# Patient Record
Sex: Male | Born: 2000 | Race: White | Hispanic: No | Marital: Single | Smoking: Current every day smoker
Health system: Southern US, Community
[De-identification: ages and names within clinical notes are randomized; demographics above are authoritative.]

## PROBLEM LIST (undated history)

## (undated) DIAGNOSIS — F909 Attention-deficit hyperactivity disorder, unspecified type: Secondary | ICD-10-CM

## (undated) HISTORY — DX: Attention-deficit hyperactivity disorder, unspecified type: F90.9

---
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
## 2018-01-27 ENCOUNTER — Other Ambulatory Visit: Payer: Self-pay

## 2018-01-27 ENCOUNTER — Emergency Department (HOSPITAL_COMMUNITY)
Admission: EM | Admit: 2018-01-27 | Discharge: 2018-01-28 | Disposition: A | Discharge status: Home / Self Care | Payer: No Typology Code available for payment source | Attending: Emergency Medicine | Admitting: Emergency Medicine

## 2018-01-27 DIAGNOSIS — Y999 Unspecified external cause status: Secondary | ICD-10-CM | POA: Diagnosis not present

## 2018-01-27 DIAGNOSIS — S0990XA Unspecified injury of head, initial encounter: Secondary | ICD-10-CM

## 2018-01-27 DIAGNOSIS — Y929 Unspecified place or not applicable: Secondary | ICD-10-CM | POA: Insufficient documentation

## 2018-01-27 DIAGNOSIS — J45909 Unspecified asthma, uncomplicated: Secondary | ICD-10-CM | POA: Insufficient documentation

## 2018-01-27 DIAGNOSIS — S098XXA Other specified injuries of head, initial encounter: Secondary | ICD-10-CM | POA: Diagnosis present

## 2018-01-27 DIAGNOSIS — Y9351 Activity, roller skating (inline) and skateboarding: Secondary | ICD-10-CM | POA: Diagnosis not present

## 2018-01-27 NOTE — ED Triage Notes (Signed)
Patient states that he was skateboarding without a helmet when her fell and hit the back of his head on concrete. Denies LOC but c/o confusion and nausea earlier that has resolved.

## 2018-01-27 NOTE — ED Provider Notes (Signed)
[facility] EMERGENCY DEPARTMENT Provider Note   CSN: [id number] Arrival date & time: 01/27/18  2240     History   Chief Complaint Chief Complaint  Patient presents with  . Head Injury    HPI Donald Goodman is a 17 y.o. male presents today for evaluation of acute onset, gradually improving headache.  He states that at around 5 PM earlier today he was skateboarding in a skate park when the skateboard slipped out from under him and he fell backwards landing on the back of his head.  He denies loss of consciousness but states he is unsure of the events leading up to the fall.  He notes that since then he has had a sharp occipital headache which does not radiate.  It improved after he smoked some marijuana.  No medications prior to arrival.  He does note intermittent vision changes but has difficulty describing them.  He notes nausea but no vomiting.  He states that he sometimes feels unsteady when he ambulates.  He states that he does frequently feel confused and has difficulty following commands, remembering things, or answering questions.  He denies numbness, tingling, weakness, neck pain, back pain, chest pain, or shortness of breath.  The history is provided by the patient.    No past medical history on file.  Patient Active Problem List   Diagnosis Date Noted  . ASTHMA 05/21/2010  . FINGER PAIN 05/21/2010  . FRACTURE, FINGER 05/21/2010    Home Medications    Prior to Admission medications   Medication Sig Start Date End Date Taking? Authorizing Provider  acetaminophen (TYLENOL) 500 MG tablet Take 1 tablet (500 mg total) by mouth every 6 (six) hours as needed. 01/28/18   Hjalmar Ballengee A, PA-C  ibuprofen (ADVIL,MOTRIN) 600 MG tablet Take 1 tablet (600 mg total) by mouth every 6 (six) hours as needed. 01/28/18   Jeanie Sewer, PA-C    Family History No family history on file.  Social History Social History   Tobacco Use  . Smoking status: Not on file    Substance Use Topics  . Alcohol use: Not on file  . Drug use: Not on file     Allergies   Patient has no known allergies.   Review of Systems Review of Systems  Eyes: Positive for photophobia and visual disturbance (?).  Respiratory: Negative for shortness of breath.   Cardiovascular: Negative for chest pain.  Gastrointestinal: Positive for nausea. Negative for abdominal pain and vomiting.  Neurological: Positive for headaches. Negative for syncope, weakness and numbness.  Psychiatric/Behavioral: Positive for confusion.  All other systems reviewed and are negative.    Physical Exam Updated Vital Signs BP (!) 95/45 (BP Location: Left Arm)   Pulse (!) 54   Temp 97.7 F (36.5 C) (Oral)   Resp 18   Ht  (1.727 m)   Wt 56.7 kg (125 lb)   SpO2 100%   BMI 19.01 kg/m   Physical Exam  Constitutional: He is oriented to person, place, and time. He appears well-developed and well-nourished. No distress.  HENT:  Head: Normocephalic.  No Battle's signs, no raccoon's eyes, no rhinorrhea. No hemotympanum. No tenderness to palpation of the face. No deformity, crepitus, or swelling noted.  There is mild tenderness to palpation of the posterior aspect of the skull, worse on the left.  There is no overlying crepitus, ecchymosis, or deformity but there is mild swelling   Eyes: Pupils are equal, round, and reactive to light.  Conjunctivae and EOM are normal. Right eye exhibits no discharge. Left eye exhibits no discharge.  Neck: Normal range of motion. Neck supple. No JVD present. No tracheal deviation present.  No midline spine TTP, no paraspinal muscle tenderness, no deformity, crepitus, or step-off noted   Cardiovascular: Normal rate, regular rhythm and normal heart sounds.  Pulmonary/Chest: Effort normal and breath sounds normal.  Abdominal: Soft. He exhibits no distension. There is no tenderness.  Musculoskeletal: Normal range of motion. He exhibits no edema or tenderness.  5/5  strength of BUE and BLE major muscle groups.  No tenderness to palpation of the extremities.  Neurological: He is alert and oriented to person, place, and time. No cranial nerve deficit or sensory deficit. He exhibits normal muscle tone.  Mental Status:  Alert, thought content appropriate.  Some confusion when providing history.  Speech fluent without evidence of aphasia.  Some difficulty following two-step commands  cranial Nerves:  II:  Peripheral visual fields grossly normal, pupils equal, round, reactive to light III,IV, VI: ptosis not present, extra-ocular motions intact bilaterally  V,VII: smile symmetric, facial light touch sensation equal VIII: hearing grossly normal to voice  X: uvula elevates symmetrically  XI: bilateral shoulder shrug symmetric and strong XII: midline tongue extension without fassiculations Motor:  Normal tone. 5/5 strength of BUE and BLE major muscle groups including strong and equal grip strength and dorsiflexion/plantar flexion Sensory: light touch normal in all extremities. Cerebellar: normal finger-to-nose with bilateral upper extremities Gait: normal gait and balance. Able to walk on toes and heels with ease.     Skin: Skin is warm and dry. No erythema.  Psychiatric: He has a normal mood and affect. His behavior is normal.  Nursing note and vitals reviewed.    ED Treatments / Results  Labs (all labs ordered are listed, but only abnormal results are displayed) Labs Reviewed - No data to display  EKG None  Radiology Ct Head Wo Contrast  Result Date: 01/28/2018 CLINICAL DATA:  Posttraumatic headache after falling. Nausea and confusion. EXAM: CT HEAD WITHOUT CONTRAST TECHNIQUE: Contiguous axial images were obtained from the base of the skull through the vertex without intravenous contrast. COMPARISON:  None. FINDINGS: Brain: There is no mass, hemorrhage or extra-axial collection. No evidence of acute cortical infarct. The size and configuration of the  ventricles and extra-axial CSF spaces are normal. The brain parenchyma is normal. Vascular: No hyperdense vessel or unexpected vascular calcification. Skull: Normal visualized skull base, calvarium and extracranial soft tissues. Sinuses/Orbits: No fluid levels or advanced mucosal thickening of the visualized paranasal sinuses. No mastoid or middle ear effusion. The orbits are normal. IMPRESSION: Normal head CT. Electronically Signed   By: Deatra Robinson M.D.   On: 01/28/2018 01:02    Procedures Procedures (including critical care time)  Medications Ordered in ED Medications - No data to display   Initial Impression / Assessment and Plan / ED Course  I have reviewed the triage vital signs and the nursing notes.  Pertinent labs & imaging results that were available during my care of the patient were reviewed by me and considered in my medical decision making (see chart for details).     Patient presents for evaluation of headache after slipping off of his skateboard and striking the posterior aspect of his skull on the ground while not wearing a helmet.  He is afebrile, mildly bradycardic with borderline hypotensive blood pressure reading. However, upon chart review using care everywhere this appears to be the patient's baseline.  He was also sleeping upon reevaluation.  He has no focal neurologic deficits on examination but does endorse possible retrograde amnesia as well as confusion and nausea after the fall.  He is ambulatory without difficulty.  Likely postconcussive in nature but we will obtain CT scan to rule out acute abnormality.  CT scan of the head shows no evidence of skull fracture, ICH, SAH, or CVA.  On reevaluation the patient is resting comfortably.  We discussed concussion precautions and I encouraged him to avoid skating for at least a week.  I also encouraged him to wear a helmet every time that he states that he tells me that he bought one today.  Recommend follow-up with his  primary care physician or in the concussion clinic in [place].  Discussed strict ED return precautions. Pt verbalized understanding of and agreement with plan and is safe for discharge home at this time.  He has no complaints prior to discharge. Final Clinical Impressions(s) / ED Diagnoses   Final diagnoses:  Injury of head, initial encounter    ED Discharge Orders        Ordered    ibuprofen (ADVIL,MOTRIN) 600 MG tablet  Every 6 hours PRN     01/28/18 0108    acetaminophen (TYLENOL) 500 MG tablet  Every 6 hours PRN     01/28/18 0108       Jeanie Sewer, PA-C 01/28/18 0125    Jacalyn Lefevre, MD 01/28/18 [phone]

## 2018-01-28 ENCOUNTER — Emergency Department (HOSPITAL_COMMUNITY): Payer: No Typology Code available for payment source

## 2018-01-28 MED ORDER — IBUPROFEN 600 MG PO TABS: 600.0000 mg | ORAL_TABLET | Freq: Four times a day (QID) | ORAL | 0 refills | Status: AC | PRN

## 2018-01-28 MED ORDER — ACETAMINOPHEN 500 MG PO TABS: 500.0000 mg | ORAL_TABLET | Freq: Four times a day (QID) | ORAL | 0 refills | Status: AC | PRN

## 2018-01-28 NOTE — Discharge Instructions (Signed)
1. Medications: You may alternate 600 mg of ibuprofen and [phone] mg of Tylenol every 3 hours as needed for pain for the next 3 to 5 days. Do not exceed 4000 mg of Tylenol daily.  Take ibuprofen with food to avoid upset stomach issues. 2. Treatment: Rest, ice on head.  Concussion precautions given - keep patient in a quiet, not simulating, dark environment. No TV, computer use, or video games until headache is resolved completely. No contact sports until cleared by the physician.  Wear a helmet when you go skateboarding but I would avoid skateboarding for the next week or so. 3. Follow Up: With primary care physician on Monday if headache persists.  I have given you the information for the concussion clinic in [place].  Call them and set up a follow-up appointment.  Return to the emergency department if patient becomes weak, begins vomiting, develops double vision, speech difficulty, problems walking or other change in mental status.

## 2018-02-06 ENCOUNTER — Encounter (HOSPITAL_COMMUNITY): Payer: Self-pay

## 2018-02-06 ENCOUNTER — Emergency Department (HOSPITAL_COMMUNITY)
Admission: EM | Admit: 2018-02-06 | Discharge: 2018-02-06 | Disposition: A | Discharge status: Home / Self Care | Payer: No Typology Code available for payment source | Attending: Emergency Medicine | Admitting: Emergency Medicine

## 2018-02-06 ENCOUNTER — Other Ambulatory Visit: Payer: Self-pay

## 2018-02-06 DIAGNOSIS — J45909 Unspecified asthma, uncomplicated: Secondary | ICD-10-CM | POA: Diagnosis not present

## 2018-02-06 DIAGNOSIS — M545 Low back pain: Secondary | ICD-10-CM | POA: Insufficient documentation

## 2018-02-06 DIAGNOSIS — Y999 Unspecified external cause status: Secondary | ICD-10-CM | POA: Diagnosis not present

## 2018-02-06 DIAGNOSIS — M7918 Myalgia, other site: Secondary | ICD-10-CM

## 2018-02-06 DIAGNOSIS — Y92410 Unspecified street and highway as the place of occurrence of the external cause: Secondary | ICD-10-CM | POA: Diagnosis not present

## 2018-02-06 DIAGNOSIS — M542 Cervicalgia: Secondary | ICD-10-CM | POA: Diagnosis present

## 2018-02-06 DIAGNOSIS — Y929 Unspecified place or not applicable: Secondary | ICD-10-CM | POA: Diagnosis not present

## 2018-02-06 DIAGNOSIS — Y939 Activity, unspecified: Secondary | ICD-10-CM | POA: Insufficient documentation

## 2018-02-06 MED ORDER — METHOCARBAMOL 500 MG PO TABS: 500.0000 mg | ORAL_TABLET | Freq: Two times a day (BID) | ORAL | 0 refills | Status: AC
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
## 2018-02-06 NOTE — Discharge Instructions (Addendum)
Please read attached information. If you experience any new or worsening signs or symptoms please return to the emergency room for evaluation. Please follow-up with your primary care provider or specialist as discussed. Please use medication prescribed only as directed and discontinue taking if you have any concerning signs or symptoms.   °
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
## 2019-08-16 IMAGING — CT CT HEAD W/O CM
4 series · 15 of 47 positions shown, 17 images · non-contrast
Comparison: None.

CLINICAL DATA: Posttraumatic headache after falling. Nausea and
confusion.

EXAM:
CT HEAD WITHOUT CONTRAST
TECHNIQUE: Contiguous axial images were obtained from the base of the skull
through the vertex without intravenous contrast.

[Series 4: head wo · axial · 0.44mm/px · z∈[-170,-50]mm · 7 of 33 slices shown, 9 images]
[im 5/33  brain]
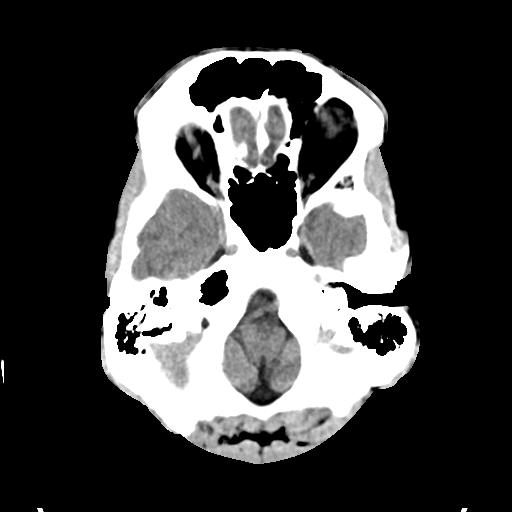
[im 5/33  bone]
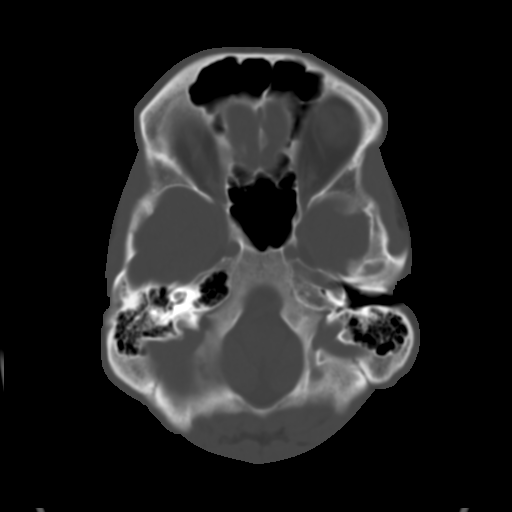
[im 9/33  brain]
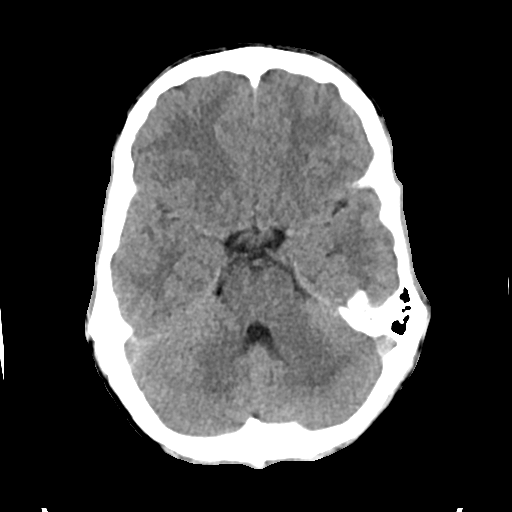
[im 13/33  brain]
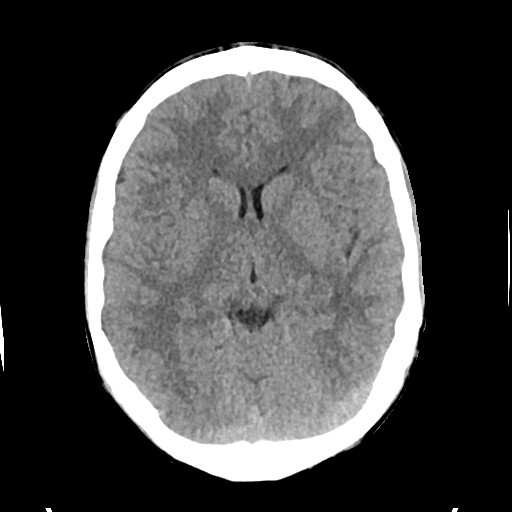
[im 17/33  brain]
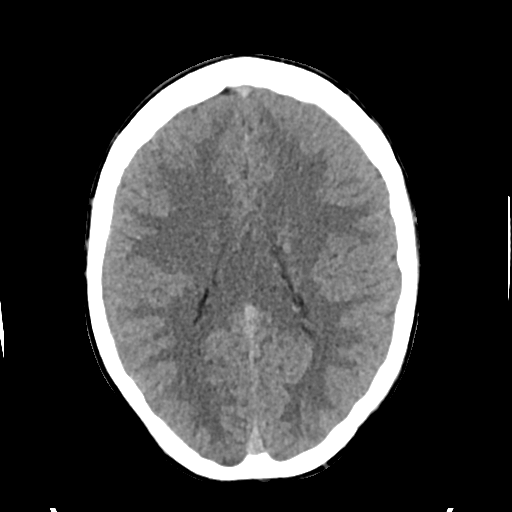
[im 21/33  brain]
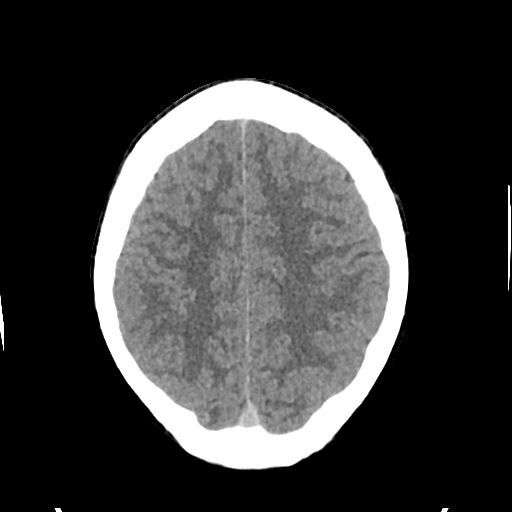
[im 21/33  bone]
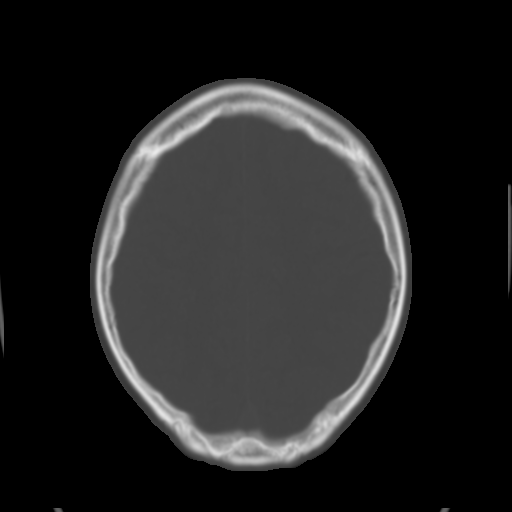
[im 25/33  brain]
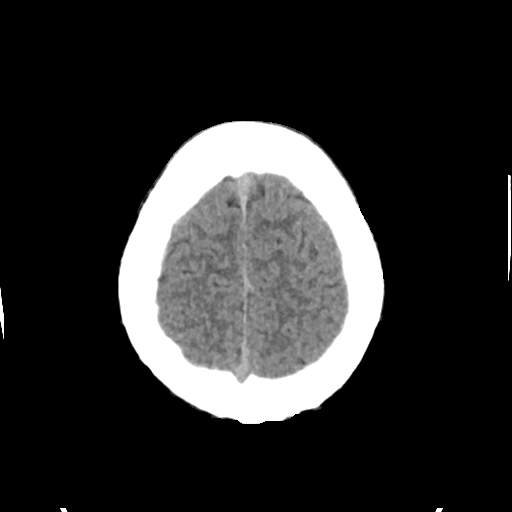
[im 29/33  brain]
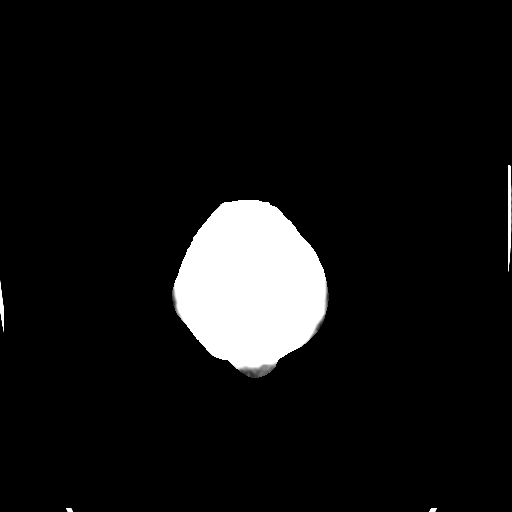

[Series 5: head bone · axial · 0.44mm/px · z∈[-174,-158]mm · 2 of 81 slices shown]
[im 9/81  bone]
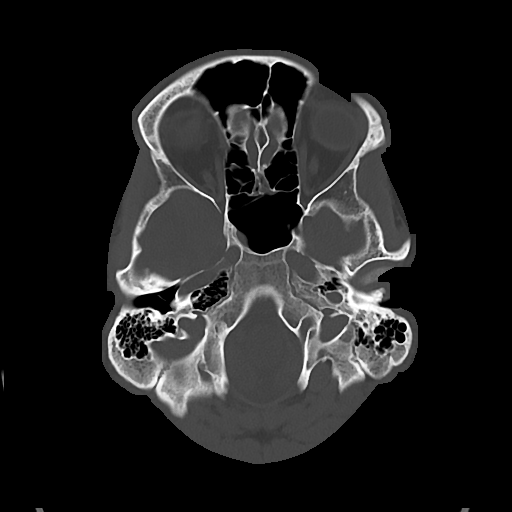
[im 17/81  bone]
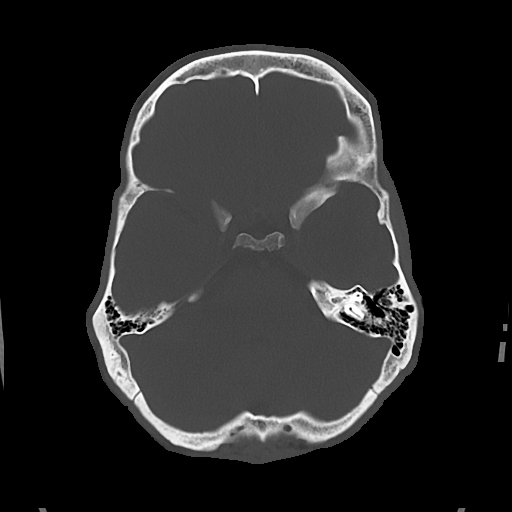

[Series 6: cor soft · coronal · 0.31mm/px · 3 of 65 slices shown]
[im 22/65  brain]
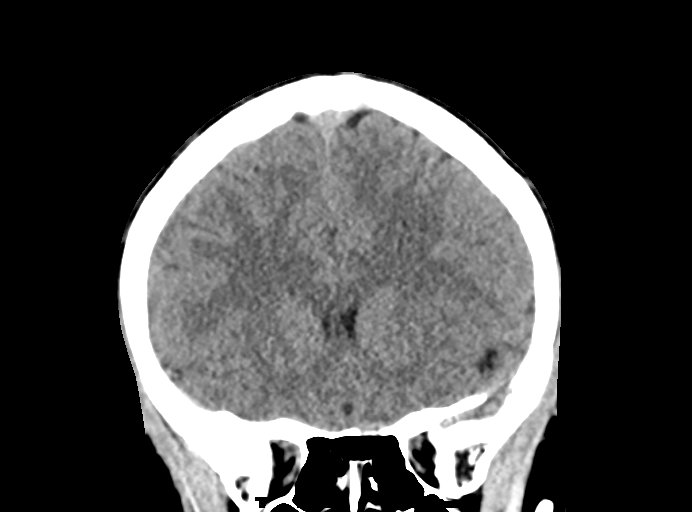
[im 29/65  brain]
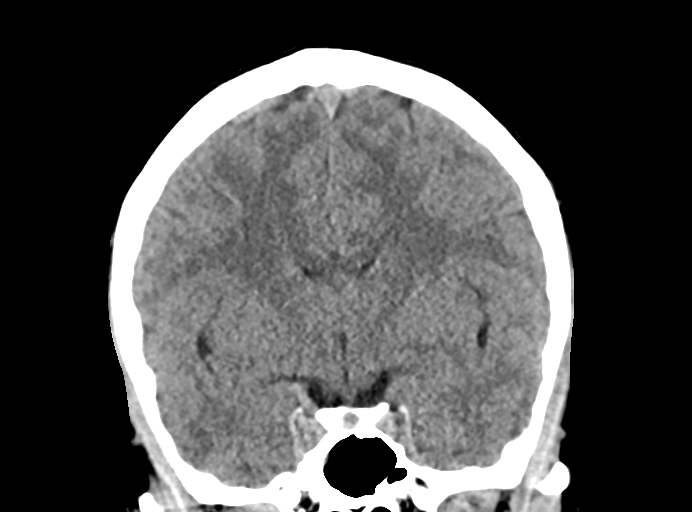
[im 36/65  brain]
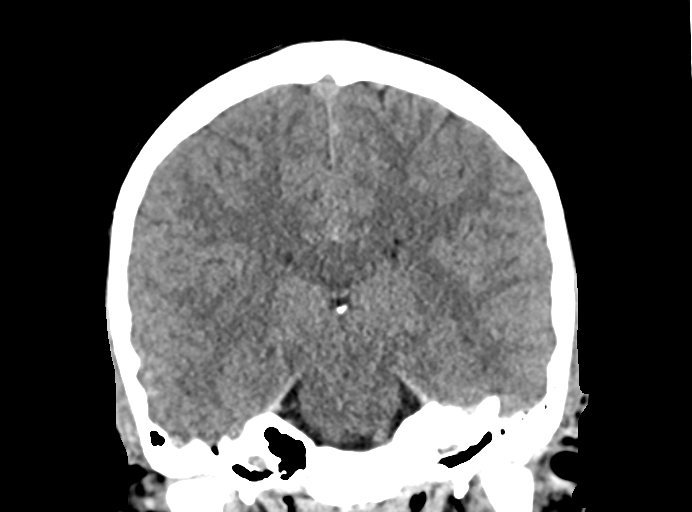

[Series 7: sag soft · sagittal · 0.31mm/px · 3 of 52 slices shown]
[im 18/52  brain]
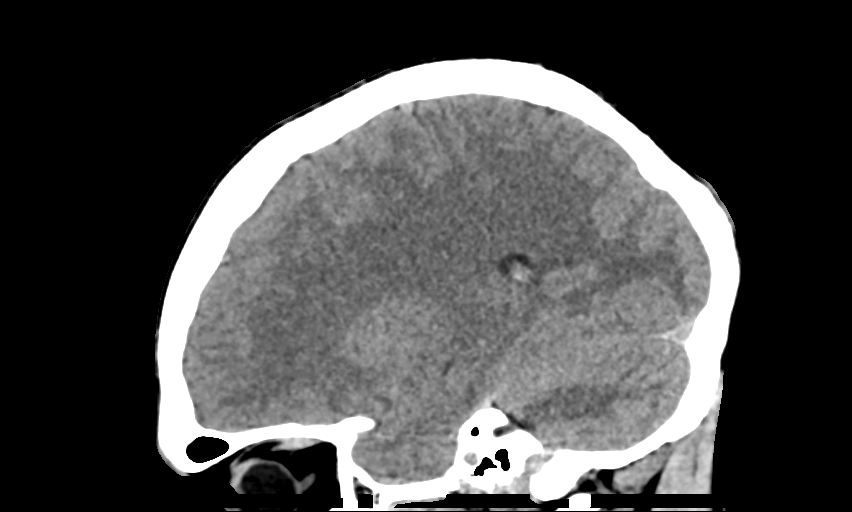
[im 26/52  brain]
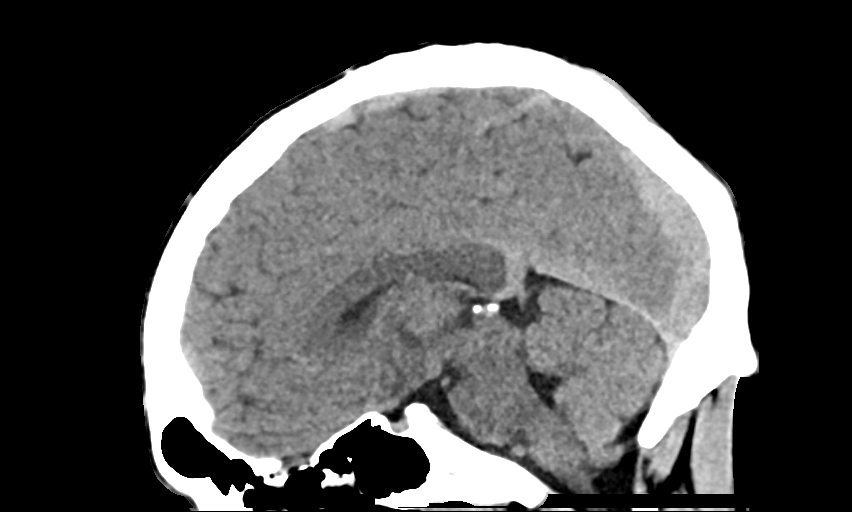
[im 35/52  brain]
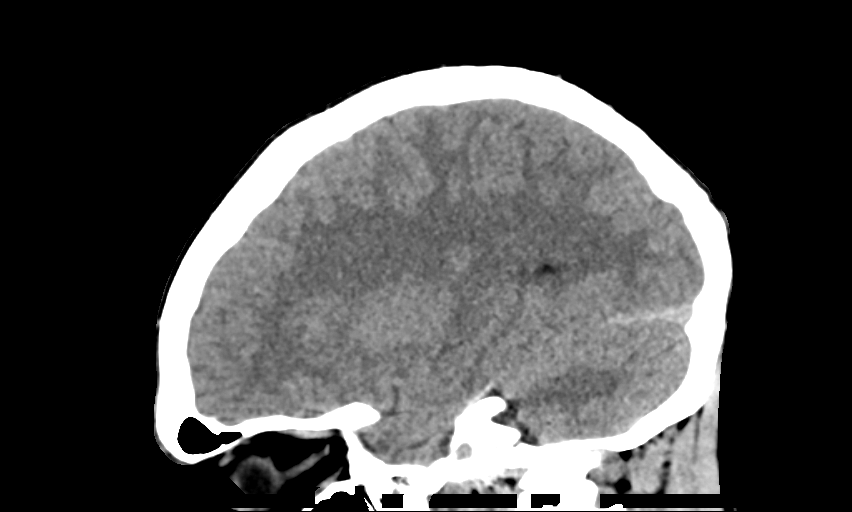

[15 of 47 positions shown; findings below may reference images not displayed]

FINDINGS: Brain: There is no mass, hemorrhage or extra-axial collection. No
evidence of acute cortical infarct. The size and configuration of
the ventricles and extra-axial CSF spaces are normal. The brain
parenchyma is normal.

Vascular: No hyperdense vessel or unexpected vascular calcification.

Skull: Normal visualized skull base, calvarium and extracranial soft
tissues.

Sinuses/Orbits: No fluid levels or advanced mucosal thickening of
the visualized paranasal sinuses. No mastoid or middle ear effusion.
The orbits are normal.
IMPRESSION: Normal head CT.

## 2020-04-30 ENCOUNTER — Ambulatory Visit
Admission: EM | Admit: 2020-04-30 | Discharge: 2020-04-30 | Disposition: A | Discharge status: Home / Self Care | Payer: Medicaid Other

## 2020-04-30 ENCOUNTER — Other Ambulatory Visit: Payer: Self-pay

## 2020-04-30 DIAGNOSIS — R197 Diarrhea, unspecified: Secondary | ICD-10-CM

## 2020-04-30 DIAGNOSIS — J3489 Other specified disorders of nose and nasal sinuses: Secondary | ICD-10-CM

## 2020-04-30 DIAGNOSIS — R059 Cough, unspecified: Secondary | ICD-10-CM

## 2020-04-30 NOTE — Discharge Instructions (Addendum)
COVID PCR testing ordered. I would like you to quarantine until testing results. You can take over the counter flonase/nasacort to help with nasal congestion/drainage. Tylenol/motrin for pain and fever. Keep hydrated, urine should be clear to pale yellow in color. If experiencing shortness of breath, trouble breathing, go to the emergency department for further evaluation needed.  

## 2020-04-30 NOTE — ED Triage Notes (Signed)
Pt c/o diarrhea for approx 3 days, cough, fever, HA, runny nose, onset yesterday with Tmax 100.  Denies sore throat, congestion, n/v, loss of taste/smell, SOB. Took Emergen-C, nyquil today.

## 2020-04-30 NOTE — ED Provider Notes (Signed)
[facility]    CSN: [id number] Arrival date & time: 04/30/20  1549      History   Chief Complaint Chief Complaint  Patient presents with  . Diarrhea  . Fever    HPI Donald Goodman is a 19 y.o. male.   19 year old male comes in for 3 day of URI symptoms. Diarrhea, cough, rhinorrhea. tmax 100. approx 5 episodes of diarrhea/day. Denies nausea/vomiting. Denies shortness of breath, loss of taste/smell.      Past Medical History:  Diagnosis Date  . ADHD     Patient Active Problem List   Diagnosis Date Noted  . ASTHMA 05/21/2010  . FINGER PAIN 05/21/2010  . FRACTURE, FINGER 05/21/2010    Past Surgical History:  Procedure Laterality Date  . APPENDECTOMY         Home Medications    Prior to Admission medications   Medication Sig Start Date End Date Taking? Authorizing Provider  albuterol (VENTOLIN HFA) 108 (90 Base) MCG/ACT inhaler Inhale into the lungs. 01/18/17  Yes [provider]  acetaminophen (TYLENOL) 500 MG tablet Take 1 tablet (500 mg total) by mouth every 6 (six) hours as needed. 01/28/18   Fawze, Mina A, PA-C  citalopram (CELEXA) 20 MG tablet Take 1 tablet by mouth daily. 02/21/17   [provider]  ibuprofen (ADVIL,MOTRIN) 600 MG tablet Take 1 tablet (600 mg total) by mouth every 6 (six) hours as needed. 01/28/18   Fawze, Mina A, PA-C  methocarbamol (ROBAXIN) 500 MG tablet Take 1 tablet (500 mg total) by mouth 2 (two) times daily. 02/06/18   Eyvonne Mechanic, PA-C    Family History Family History  Family history unknown: Yes    Social History Social History   Tobacco Use  . Smoking status: Current Every Day Smoker    Packs/day: 0.50    Types: Cigarettes  . Smokeless tobacco: Never Used  Vaping Use  . Vaping Use: Some days  Substance Use Topics  . Alcohol use: Yes  . Drug use: Yes    Types: Marijuana     Allergies   Patient has no known allergies.   Review of Systems Review of Systems  Reason unable to perform  ROS: See HPI as above.     Physical Exam Triage Vital Signs ED Triage Vitals  Enc Vitals Group     BP 04/30/20 1640 116/80     Pulse Rate 04/30/20 1640 62     Resp 04/30/20 1640 16     Temp 04/30/20 1640 98.2 F (36.8 C)     Temp Source 04/30/20 1640 Oral     SpO2 04/30/20 1640 97 %     Weight --      Height --      Head Circumference --      Peak Flow --      Pain Score 04/30/20 1651 0     Pain Loc --      Pain Edu? --      Excl. in GC? --    No data found.  Updated Vital Signs BP 116/80 (BP Location: Left Arm)   Pulse 62   Temp 98.2 F (36.8 C) (Oral)   Resp 16   SpO2 97%   Physical Exam Constitutional:      General: He is not in acute distress.    Appearance: Normal appearance. He is not ill-appearing, toxic-appearing or diaphoretic.  HENT:     Head: Normocephalic and atraumatic.     Mouth/Throat:  Mouth: Mucous membranes are moist.     Pharynx: Oropharynx is clear. Uvula midline.  Cardiovascular:     Rate and Rhythm: Normal rate and regular rhythm.     Heart sounds: Normal heart sounds. No murmur heard.  No friction rub. No gallop.   Pulmonary:     Effort: Pulmonary effort is normal. No accessory muscle usage, prolonged expiration, respiratory distress or retractions.     Comments: Lungs clear to auscultation without adventitious lung sounds. Abdominal:     General: Bowel sounds are normal.     Palpations: Abdomen is soft.     Tenderness: There is no abdominal tenderness. There is no guarding or rebound.  Musculoskeletal:     Cervical back: Normal range of motion and neck supple.  Skin:    General: Skin is warm and dry.  Neurological:     General: No focal deficit present.     Mental Status: He is alert and oriented to person, place, and time.      UC Treatments / Results  Labs (all labs ordered are listed, but only abnormal results are displayed) Labs Reviewed  NOVEL CORONAVIRUS, NAA    EKG   Radiology No results  found.  Procedures Procedures (including critical care time)  Medications Ordered in UC Medications - No data to display  Initial Impression / Assessment and Plan / UC Course  I have reviewed the triage vital signs and the nursing notes.  Pertinent labs & imaging results that were available during my care of the patient were reviewed by me and considered in my medical decision making (see chart for details).    COVID PCR test ordered. Patient to quarantine until testing results return. No alarming signs on exam. LCTAB. Symptomatic treatment discussed.  Push fluids.  Return precautions given.  Patient expresses understanding and agrees to plan.  Final Clinical Impressions(s) / UC Diagnoses   Final diagnoses:  Diarrhea, unspecified type  Cough  Rhinorrhea   ED Prescriptions    None     PDMP not reviewed this encounter.   Belinda Fisher, PA-C 04/30/20 1738

## 2020-05-02 ENCOUNTER — Telehealth: Payer: Self-pay

## 2020-05-02 LAB — SARS-COV-2, NAA 2 DAY TAT

## 2020-05-02 LAB — NOVEL CORONAVIRUS, NAA: SARS-CoV-2, NAA: DETECTED — AB

## 2020-05-02 NOTE — Telephone Encounter (Signed)
Patient called and was informed that his COVID-19 test was positive and he can pass the germ to others. He denies symptoms.  Symptom tiers and treatment plans for each were read to patient. Criteria for ending isolation were read to patient. Good preventative practices were discussed. He verbalized understanding. [place] [facility] will be notified.
